# Patient Record
Sex: Male | Born: 1945 | Race: Black or African American | Hispanic: No | Marital: Married | Smoking: Never smoker
Health system: Southern US, Community
[De-identification: ages and names within clinical notes are randomized; demographics above are authoritative.]

## PROBLEM LIST (undated history)

## (undated) DIAGNOSIS — I10 Essential (primary) hypertension: Secondary | ICD-10-CM

---

## 2015-08-05 ENCOUNTER — Encounter (HOSPITAL_COMMUNITY): Payer: Self-pay | Admitting: Emergency Medicine

## 2015-08-05 ENCOUNTER — Emergency Department (INDEPENDENT_AMBULATORY_CARE_PROVIDER_SITE_OTHER)
Admission: EM | Admit: 2015-08-05 | Discharge: 2015-08-05 | Disposition: A | Discharge status: Home / Self Care | Payer: Medicare Other | Source: Home / Self Care | Attending: Emergency Medicine | Admitting: Emergency Medicine

## 2015-08-05 DIAGNOSIS — J40 Bronchitis, not specified as acute or chronic: Secondary | ICD-10-CM | POA: Diagnosis not present

## 2015-08-05 HISTORY — DX: Essential (primary) hypertension: I10

## 2015-08-05 MED ORDER — AZITHROMYCIN 250 MG PO TABS: ORAL_TABLET | ORAL | Status: AC

## 2015-08-05 MED ORDER — PREDNISONE 50 MG PO TABS: ORAL_TABLET | ORAL | Status: AC

## 2015-08-05 MED ORDER — HYDROCODONE-HOMATROPINE 5-1.5 MG/5ML PO SYRP: 5.0000 mL | ORAL_SOLUTION | Freq: Four times a day (QID) | ORAL | Status: AC | PRN

## 2015-08-05 NOTE — ED Notes (Signed)
Cough and nagging cough.  Chest soreness with coughing. Symptoms for one week.  Patient and spouse thought he was improving, now not so sure.  Noticed sob with using steps at home yesterday

## 2015-08-05 NOTE — ED Provider Notes (Signed)
CSN: [id number]     Arrival date & time 08/05/15  1949 History   First MD Initiated Contact with Patient 08/05/15 2055     Chief Complaint  Patient presents with  . Cough  . Chest Pain   (Consider location/radiation/quality/duration/timing/severity/associated sxs/prior Treatment) HPI He is a 70 year old man here for evaluation of cough. His symptoms started about a week ago with cough and nasal congestion. He took some over-the-counter medications and seemed to be getting better, until the last day or 2. He reports worsening cough and congestion. His cough is productive of mucus. He describes posttussive gagging and emesis. He also reports pain in his chest with coughing. No fevers or wheezing. He does report feeling a little short of breath with exertion and after coughing spells. He has been using an over-the-counter cough medicine with good improvement of the cough.  No diaphoresis or dizziness.  Past Medical History  Diagnosis Date  . Hypertension    History reviewed. No pertinent past surgical history. No family history on file. Social History  Substance Use Topics  . Smoking status: Never Smoker   . Smokeless tobacco: None  . Alcohol Use: Yes    Review of Systems As in history of present illness Allergies  Review of patient's allergies indicates no known allergies.  Home Medications   Prior to Admission medications   Medication Sig Start Date End Date Taking? Authorizing Provider  azithromycin (ZITHROMAX Z-PAK) 250 MG tablet Take 2 pills today, then 1 pill daily until gone. 08/05/15   Charm Rings, MD  HYDROcodone-homatropine [facility]) 5-1.5 MG/5ML syrup Take 5 mLs by mouth every 6 (six) hours as needed for cough. 08/05/15   Charm Rings, MD  predniSONE (DELTASONE) 50 MG tablet Take 1 pill daily for 5 days. 08/05/15   Charm Rings, MD   Meds Ordered and Administered this Visit  Medications - No data to display  BP 147/89 mmHg  Pulse 70  Temp(Src) 98 F (36.7 C) (Oral)   Resp 16  SpO2 99% No data found.   Physical Exam  Constitutional: He is oriented to person, place, and time. He appears well-developed and well-nourished. No distress.  HENT:  Nose: Nose normal.  Mouth/Throat: Oropharynx is clear and moist. No oropharyngeal exudate.  Neck: Neck supple.  Cardiovascular: Normal rate, regular rhythm and normal heart sounds.   No murmur heard. Pulmonary/Chest: Effort normal and breath sounds normal. No respiratory distress. He has no wheezes. He has no rales. He exhibits tenderness.  Lymphadenopathy:    He has no cervical adenopathy.  Neurological: He is alert and oriented to person, place, and time.    ED Course  Procedures (including critical care time)  Labs Review Labs Reviewed - No data to display  Imaging Review No results found.    MDM   1. Bronchitis    Treatment with azithromycin and prednisone. Prescription given for Hycodan to be filled if needed for cough. Return precautions reviewed.    Charm Rings, MD 08/05/15 2115

## 2015-08-05 NOTE — Discharge Instructions (Signed)
You have bronchitis. Take azithromycin and prednisone as prescribed. Use hycodan as needed for cough. He will be no longer contagious after 24 hours of antibiotics. You should see improvement in the next 3-5 days. If you develop fevers, difficulty breathing, or are just not getting better, please come back or go to the emergency room.

## 2020-03-17 ENCOUNTER — Other Ambulatory Visit: Payer: Self-pay

## 2020-03-17 ENCOUNTER — Other Ambulatory Visit: Payer: Medicare Other

## 2020-03-17 DIAGNOSIS — Z20822 Contact with and (suspected) exposure to covid-19: Secondary | ICD-10-CM

## 2020-03-18 LAB — NOVEL CORONAVIRUS, NAA: SARS-CoV-2, NAA: NOT DETECTED

## 2020-03-18 LAB — SARS-COV-2, NAA 2 DAY TAT

## 2020-09-08 ENCOUNTER — Encounter (HOSPITAL_BASED_OUTPATIENT_CLINIC_OR_DEPARTMENT_OTHER): Payer: Self-pay | Admitting: *Deleted

## 2020-09-08 ENCOUNTER — Emergency Department (HOSPITAL_BASED_OUTPATIENT_CLINIC_OR_DEPARTMENT_OTHER): Payer: Medicare Other

## 2020-09-08 ENCOUNTER — Emergency Department (HOSPITAL_BASED_OUTPATIENT_CLINIC_OR_DEPARTMENT_OTHER)
Admission: EM | Admit: 2020-09-08 | Discharge: 2020-09-09 | Disposition: A | Discharge status: Home / Self Care | Payer: Medicare Other | Attending: Emergency Medicine | Admitting: Emergency Medicine

## 2020-09-08 ENCOUNTER — Other Ambulatory Visit: Payer: Self-pay

## 2020-09-08 DIAGNOSIS — W01198A Fall on same level from slipping, tripping and stumbling with subsequent striking against other object, initial encounter: Secondary | ICD-10-CM | POA: Diagnosis not present

## 2020-09-08 DIAGNOSIS — M25532 Pain in left wrist: Secondary | ICD-10-CM

## 2020-09-08 DIAGNOSIS — M542 Cervicalgia: Secondary | ICD-10-CM | POA: Diagnosis not present

## 2020-09-08 DIAGNOSIS — I10 Essential (primary) hypertension: Secondary | ICD-10-CM | POA: Insufficient documentation

## 2020-09-08 DIAGNOSIS — S60512A Abrasion of left hand, initial encounter: Secondary | ICD-10-CM | POA: Insufficient documentation

## 2020-09-08 DIAGNOSIS — W19XXXA Unspecified fall, initial encounter: Secondary | ICD-10-CM

## 2020-09-08 DIAGNOSIS — Z23 Encounter for immunization: Secondary | ICD-10-CM | POA: Insufficient documentation

## 2020-09-08 DIAGNOSIS — S0083XA Contusion of other part of head, initial encounter: Secondary | ICD-10-CM

## 2020-09-08 DIAGNOSIS — S60511A Abrasion of right hand, initial encounter: Secondary | ICD-10-CM | POA: Insufficient documentation

## 2020-09-08 DIAGNOSIS — S0993XA Unspecified injury of face, initial encounter: Secondary | ICD-10-CM | POA: Diagnosis present

## 2020-09-08 MED ORDER — TETANUS-DIPHTH-ACELL PERTUSSIS 5-2.5-18.5 LF-MCG/0.5 IM SUSY
0.5000 mL | PREFILLED_SYRINGE | Freq: Once | INTRAMUSCULAR | Status: AC
  Administered 2020-09-08: 0.5 mL via INTRAMUSCULAR
  Filled 2020-09-08: qty 0.5

## 2020-09-08 MED ORDER — ACETAMINOPHEN 325 MG PO TABS
650.0000 mg | ORAL_TABLET | Freq: Once | ORAL | Status: AC
  Administered 2020-09-08: 650 mg via ORAL
  Filled 2020-09-08: qty 2

## 2020-09-08 NOTE — ED Triage Notes (Signed)
Emergency Medicine Provider Triage Evaluation Note  Billy Marsh , a 75 y.o. male  was evaluated in triage.  Pt complains of fall.  He tripped and landed with both outstretched arms.  He hit his face and has a small area of swelling to the left lip, no through and through mucosal surface lacerations.  Teeth are intact.  Unsure of previous tetanus shot.  Pain in the left wrist..  Review of Systems  Positive: Injury, abrasion Negative: Loss of consciousness  Physical Exam  BP (!) 162/95 (BP Location: Left Arm)   Pulse 95   Temp 98.6 F (37 C) (Oral)   Resp 16   Ht 6\' 4"  (1.93 m)   Wt 116.5 kg   SpO2 99%   BMI 31.27 kg/m  Gen:   Awake, no distress   HEENT:  Atraumatic  Resp:  Normal effort  Cardiac:  Normal rate  Abd:   Nondistended, nontender  MSK:   Pain with any movement of the left wrist Neuro:  Speech clear   Medical Decision Making  Medically screening exam initiated at 7:48 PM.  Appropriate orders placed.  NEEDHAM BIGGINS was informed that the remainder of the evaluation will be completed by another provider, this initial triage assessment does not replace that evaluation, and the importance of remaining in the ED until their evaluation is complete.  Clinical Impression  75 year old with fall.  Tetanus updated.  Images ordered.   62, PA-C 09/08/20 1952

## 2020-09-08 NOTE — ED Provider Notes (Signed)
[facility] EMERGENCY DEPARTMENT Provider Note   CSN: [id number] Arrival date & time: 09/08/20  1918     History Chief Complaint  Patient presents with  . Fall    Billy Marsh is a 75 y.o. male.  Patient presents after a fall.  He states he stumbled on the sidewalk and fell forward striking his face with outstretched arms.  He sustained abrasions to his nose and lip and with both hands.  Did not lose consciousness.  Denies any preceding dizziness or lightheadedness.  No chest pain or shortness of breath.  Complains of face pain, neck pain, bilateral hand and wrist pain. He denies any blood thinner use.  He denies any chest, back or abdominal pain. He denies any chronic medical conditions.  Tetanus was updated today. States his dentures are sore but no broken teeth or malocclusion.  The history is provided by the patient.  Fall Pertinent negatives include no chest pain, no abdominal pain, no headaches and no shortness of breath.       Past Medical History:  Diagnosis Date  . Hypertension     There are no problems to display for this patient.   History reviewed. No pertinent surgical history.     No family history on file.  Social History   Tobacco Use  . Smoking status: Never Smoker  . Smokeless tobacco: Never Used  Substance Use Topics  . Alcohol use: Yes  . Drug use: No    Home Medications Prior to Admission medications   Medication Sig Start Date End Date Taking? Authorizing Provider  azithromycin (ZITHROMAX Z-PAK) 250 MG tablet Take 2 pills today, then 1 pill daily until gone. 08/05/15   Charm Rings, MD  HYDROcodone-homatropine [facility]) 5-1.5 MG/5ML syrup Take 5 mLs by mouth every 6 (six) hours as needed for cough. 08/05/15   Charm Rings, MD  predniSONE (DELTASONE) 50 MG tablet Take 1 pill daily for 5 days. 08/05/15   Charm Rings, MD    Allergies    Neosporin original [bacitracin-neomycin-polymyxin]  Review of Systems   Review of Systems   Constitutional: Negative for activity change, appetite change and fever.  HENT: Negative for congestion and drooling.   Eyes: Negative for visual disturbance.  Respiratory: Negative for cough, chest tightness and shortness of breath.   Cardiovascular: Negative for chest pain.  Gastrointestinal: Negative for abdominal pain, nausea and vomiting.  Genitourinary: Negative for dysuria and hematuria.  Musculoskeletal: Positive for arthralgias and myalgias.  Skin: Positive for wound.  Neurological: Negative for dizziness, weakness and headaches.   all other systems are negative except as noted in the HPI and PMH.    Physical Exam Updated Vital Signs BP (!) 162/95 (BP Location: Left Arm)   Pulse 95   Temp 98.6 F (37 C) (Oral)   Resp 16   Ht 6\' 4"  (1.93 m)   Wt 116.5 kg   SpO2 99%   BMI 31.27 kg/m   Physical Exam Vitals and nursing note reviewed.  Constitutional:      General: He is not in acute distress.    Appearance: He is well-developed.  HENT:     Head: Normocephalic.     Nose:     Comments: Abrasion L nasal ala Abrasion L upper lip.  No through and through lacerations Dentition intact    Mouth/Throat:     Pharynx: No oropharyngeal exudate.  Eyes:     Conjunctiva/sclera: Conjunctivae normal.     Pupils: Pupils are equal,  round, and reactive to light.  Neck:     Comments: Diffuse paraspinal C spine tenderness Cardiovascular:     Rate and Rhythm: Normal rate and regular rhythm.     Heart sounds: Normal heart sounds. No murmur heard.   Pulmonary:     Effort: Pulmonary effort is normal. No respiratory distress.     Breath sounds: Normal breath sounds.  Abdominal:     Palpations: Abdomen is soft.     Tenderness: There is no abdominal tenderness. There is no guarding or rebound.  Musculoskeletal:        General: Swelling, tenderness and signs of injury present.     Cervical back: Normal range of motion and neck supple.     Comments: Abrasions to bilateral palms.   There is diffuse tenderness to the left wrist and snuffbox tenderness. Reduced ROM L fingers Tenderness to left forearm. No tenderness to right snuffbox. Full range of motion of bilateral shoulders and elbows.  Skin:    General: Skin is warm.  Neurological:     Mental Status: He is alert and oriented to person, place, and time.     Cranial Nerves: No cranial nerve deficit.     Motor: No abnormal muscle tone.     Coordination: Coordination normal.     Comments:  5/5 strength throughout. CN 2-12 intact.Equal grip strength.   Psychiatric:        Behavior: Behavior normal.     ED Results / Procedures / Treatments   Labs (all labs ordered are listed, but only abnormal results are displayed) Labs Reviewed - No data to display  EKG None  Radiology DG Wrist Complete Left  Result Date: 09/08/2020 CLINICAL DATA:  Trip and fall injury.  Pain and abrasions. EXAM: LEFT WRIST - COMPLETE 3+ VIEW COMPARISON:  None. FINDINGS: There is an old appearing ununited ossicle over the dorsal aspect of the wrist. No evidence of acute fracture or dislocation. No focal bone destruction. Soft tissues are unremarkable. Benign-appearing bone cyst in the distal ulna. IMPRESSION: No acute bony abnormalities. 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 Electronically Signed   By: Burman Nieves M.D.   On: 09/08/2020 20:27   DG Wrist Complete Right  Result Date: 09/08/2020 CLINICAL DATA:  Trip and fall injury with pain and abrasions to the wrist. EXAM: RIGHT WRIST - COMPLETE 3+ VIEW COMPARISON:  None. FINDINGS: Prominent degenerative changes in the radiocarpal, radioulnar, and STT joints. No evidence of acute fracture or dislocation. Degenerative subcortical cyst demonstrated in the distal radius and ulna. No destructive or expansile bone lesions. Soft tissues are unremarkable. 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 No acute bony abnormalities. Electronically Signed   By: Burman Nieves M.D.   On: 09/08/2020 20:28   CT Head Wo Contrast  Result Date: 09/08/2020 CLINICAL DATA:  Patient fell earlier today with facial trauma. Pain to the back of the head. EXAM: CT HEAD WITHOUT CONTRAST CT MAXILLOFACIAL WITHOUT CONTRAST TECHNIQUE: Multidetector CT imaging of the head and maxillofacial structures were performed using the standard protocol without intravenous contrast. Multiplanar CT image reconstructions of the maxillofacial structures were also generated. COMPARISON:  MRI brain 03/09/2016 FINDINGS: CT HEAD FINDINGS Brain: No evidence of acute infarction, hemorrhage, hydrocephalus, extra-axial collection or mass lesion/mass effect. Mild cerebral atrophy. Vascular: Mild intracranial arterial vascular calcifications. Skull: Calvarium appears intact. No acute depressed skull fractures. Other: None. CT MAXILLOFACIAL FINDINGS Osseous: The nasal bones, orbital bones, facial bones, and mandibles appear intact.  No acute displaced fractures are identified. Multiple prior tooth extractions. Degenerative changes noted in the cervical spine. Orbits: The globes and extraocular muscles appear intact and symmetrical. Sinuses: Mucosal thickening in the sphenoid sinus. Paranasal sinuses are otherwise clear. Soft tissues: No significant facial or periorbital soft tissue swelling or gas. IMPRESSION: 1. No acute intracranial abnormalities. Mild cerebral atrophy. 2. No acute displaced orbital or facial fractures identified. Electronically Signed   By: Burman Nieves M.D.   On: 09/08/2020 20:23   CT Maxillofacial Wo Contrast  Result Date: 09/08/2020 CLINICAL DATA:  Patient fell earlier today with facial trauma. Pain to the back of the head. EXAM: CT HEAD WITHOUT CONTRAST CT MAXILLOFACIAL WITHOUT CONTRAST TECHNIQUE: Multidetector CT imaging of the head and maxillofacial structures were performed using the standard protocol without intravenous contrast. Multiplanar CT image reconstructions of the maxillofacial  structures were also generated. COMPARISON:  MRI brain 03/09/2016 FINDINGS: CT HEAD FINDINGS Brain: No evidence of acute infarction, hemorrhage, hydrocephalus, extra-axial collection or mass lesion/mass effect. Mild cerebral atrophy. Vascular: Mild intracranial arterial vascular calcifications. Skull: Calvarium appears intact. No acute depressed skull fractures. Other: None. CT MAXILLOFACIAL FINDINGS Osseous: The nasal bones, orbital bones, facial bones, and mandibles appear intact. No acute displaced fractures are identified. Multiple prior tooth extractions. Degenerative changes noted in the cervical spine. Orbits: The globes and extraocular muscles appear intact and symmetrical. Sinuses: Mucosal thickening in the sphenoid sinus. Paranasal sinuses are otherwise clear. Soft tissues: No significant facial or periorbital soft tissue swelling or gas. IMPRESSION: 1. No acute intracranial abnormalities. Mild cerebral atrophy. 2. No acute displaced orbital or facial fractures identified. Electronically Signed   By: Burman Nieves M.D.   On: 09/08/2020 20:23    Procedures Procedures   Medications Ordered in ED Medications  acetaminophen (TYLENOL) tablet 650 mg (650 mg Oral Given 09/08/20 2253)  Tdap (BOOSTRIX) injection 0.5 mL (0.5 mLs Intramuscular Given 09/08/20 2254)    ED Course  I have reviewed the triage vital signs and the nursing notes.  Pertinent labs & imaging results that were available during my care of the patient were reviewed by me and considered in my medical decision making (see chart for details).    MDM Rules/Calculators/A&P                         Mechanical fall with facial injury and bilateral hand and wrist injuries.  Vitals are stable.  Tetanus is updated.  CT head and max face are negative.  Wrist x-rays are negative.  Patient however does have snuffbox tenderness and will place in spica splint.  Traumatic imaging is negative.  Patient with no fractures in his wrists or  hands.  CT head, C-spine x-rays are negative.  Given his persistent tenderness on the left wrist were placed in spica splint. Follow-up with hand surgery for concern for occult scaphoid fracture.  Use Tylenol or ibuprofen as needed for musculoskeletal pain.  Follow-up with hand surgery.  Return precautions discussed. Final Clinical Impression(s) / ED Diagnoses Final diagnoses:  Fall, initial encounter  Contusion of face, initial encounter  Left wrist pain    Rx / DC Orders ED Discharge Orders    None       Liyat Faulkenberry, Jeannett Senior, MD 09/09/20 [phone]

## 2020-09-08 NOTE — ED Triage Notes (Signed)
Pt tripped over uneven pavement and fell. Pain to both palms of his hands with abrasions noted, left eyelid and left side of his nose. No LOC.

## 2020-09-09 NOTE — Discharge Instructions (Signed)
Your x-rays and CT scans are negative for broken bones or acute injury.  As we discussed there is a suspicion of a possible occult fracture in your left wrist.  Wear the splint and follow-up with the hand surgeon in the office.  You may use Tylenol or ibuprofen as needed for pain.  Return to the ED with new or worsening symptoms.

## 2022-04-28 IMAGING — CT CT CERVICAL SPINE W/O CM
4 series · 15 of 33 positions shown, 18 images · non-contrast
Comparison: None.

CLINICAL DATA: Fall

EXAM:
CT CERVICAL SPINE WITHOUT CONTRAST
TECHNIQUE: Multidetector CT imaging of the cervical spine was performed without
intravenous contrast. Multiplanar CT image reconstructions were also
generated.

[Series 3: c spine soft · axial · 0.35mm/px · z∈[-185,-155]mm · 2 of 90 slices shown]
[im 15/90  soft-tissue]
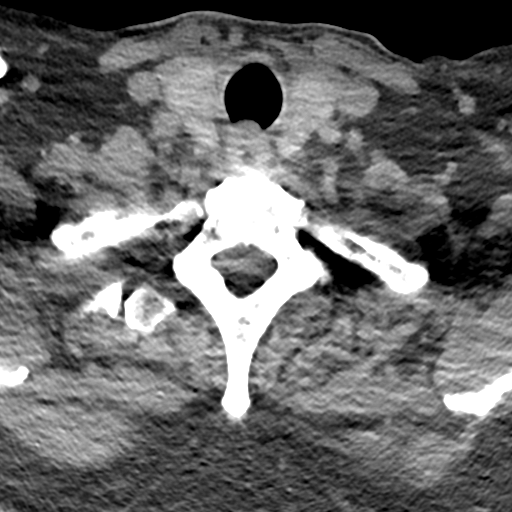
[im 30/90  soft-tissue]
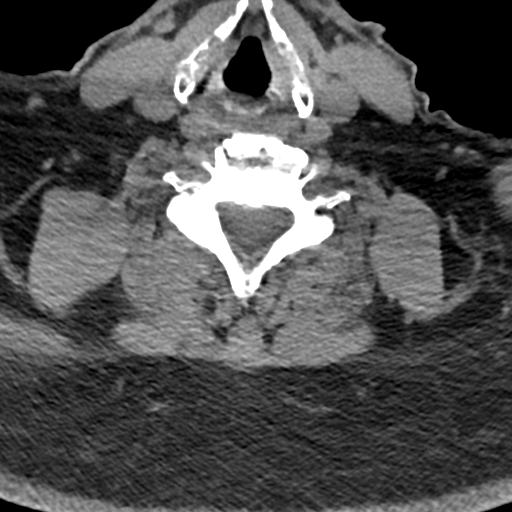

[Series 5: sagittal bone · sagittal · 0.30mm/px · 5 of 61 slices shown, 6 images]
[im 21/61  bone]
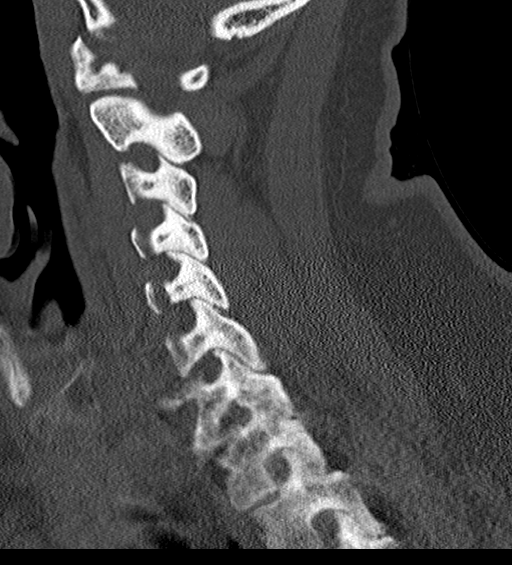
[im 26/61  bone]
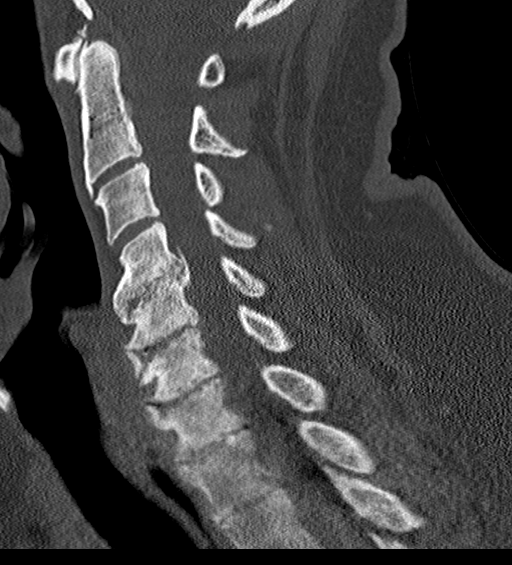
[im 31/61  soft-tissue]
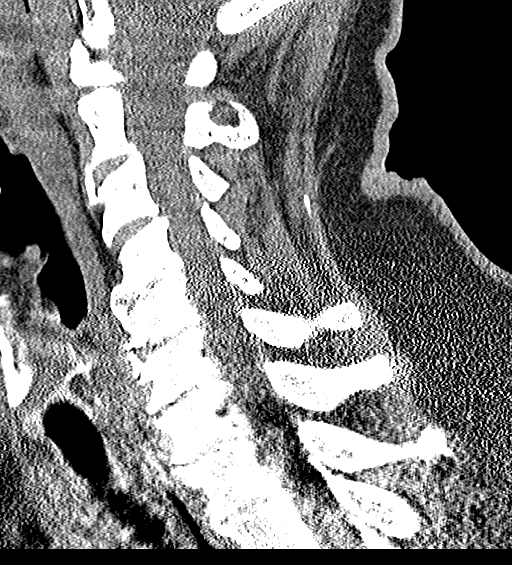
[im 31/61  bone]
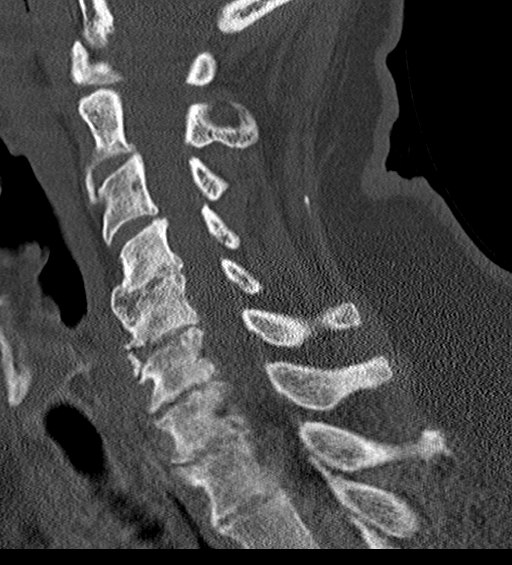
[im 36/61  bone]
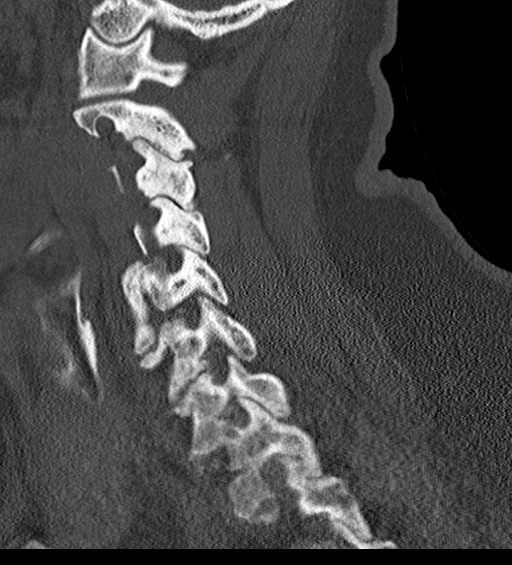
[im 41/61  bone]
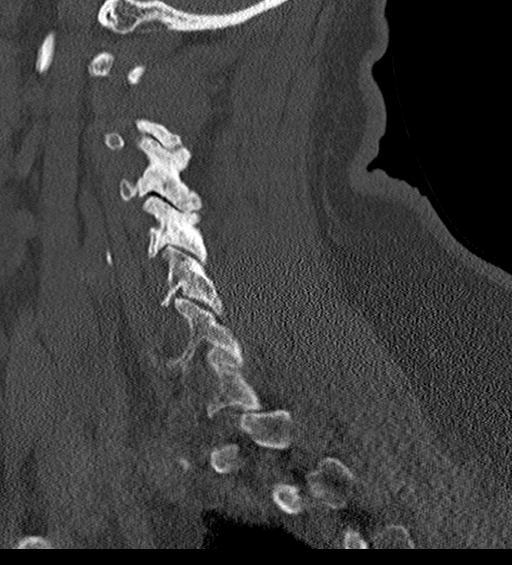

[Series 6: coronal bone · coronal · 0.26mm/px · 3 of 57 slices shown]
[im 12/57  bone]
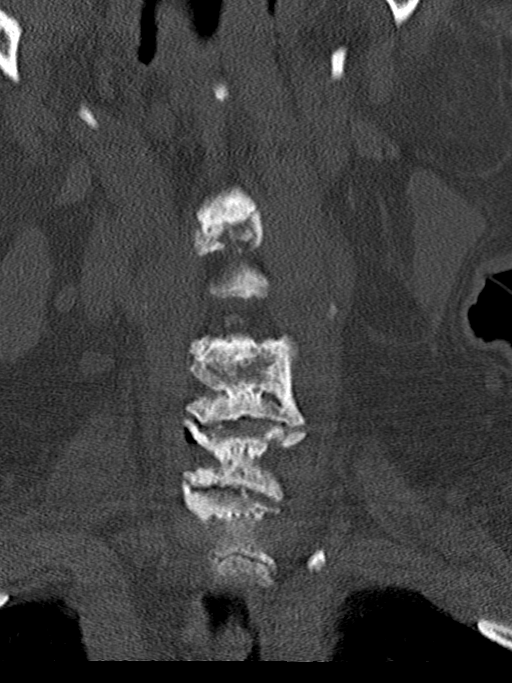
[im 23/57  bone]
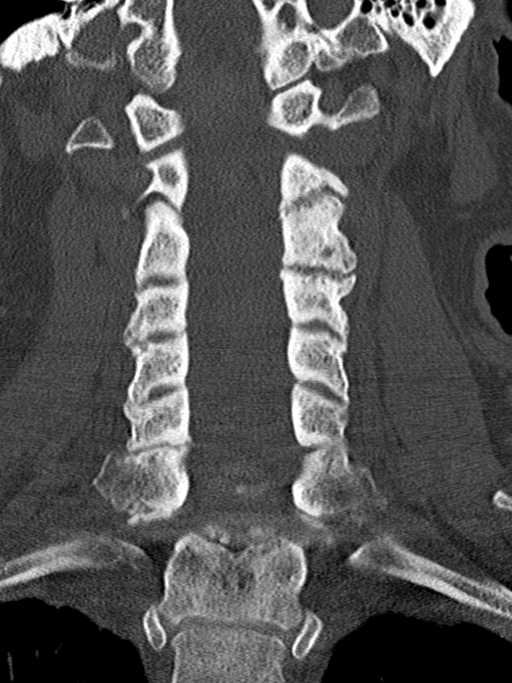
[im 34/57  bone]
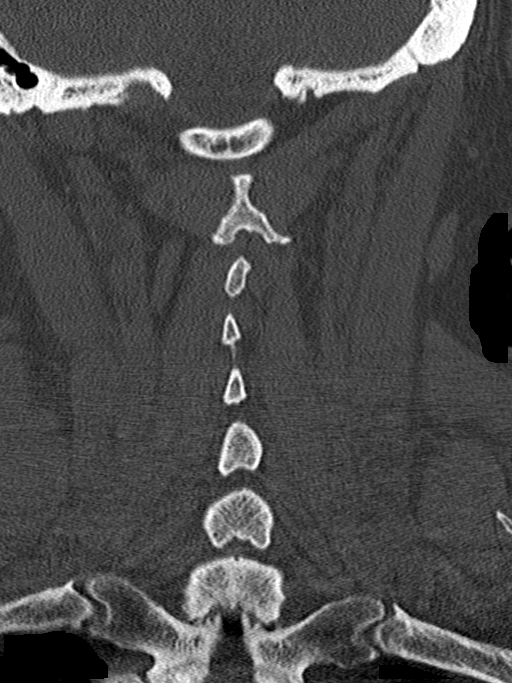

[Series 7: orthogonal bone · axial · 0.23mm/px · z∈[-202,-86]mm · 5 of 90 slices shown, 7 images]
[im 15/90  soft-tissue]
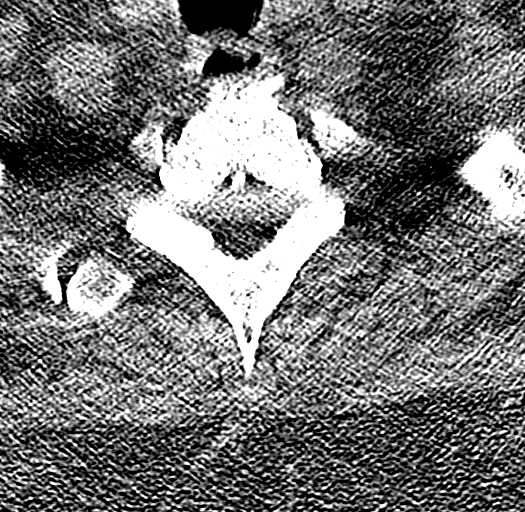
[im 15/90  bone]
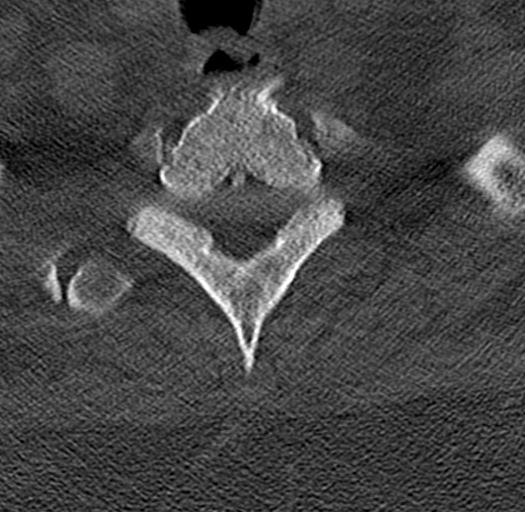
[im 30/90  bone]
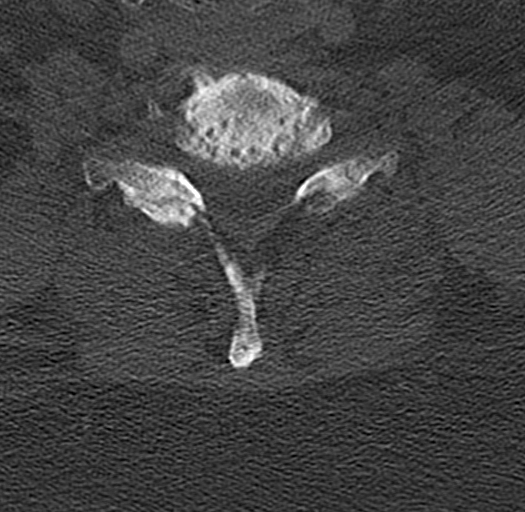
[im 45/90  bone]
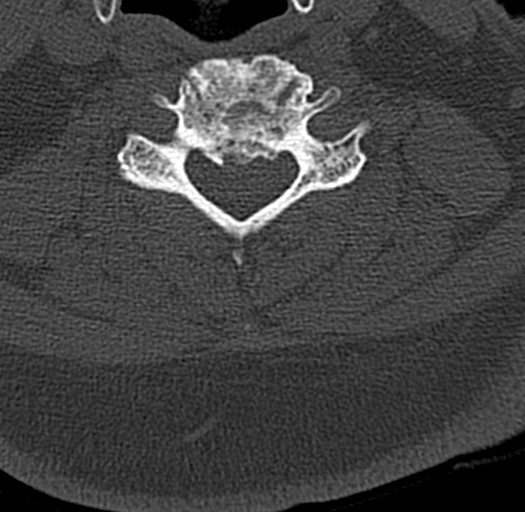
[im 60/90  bone]
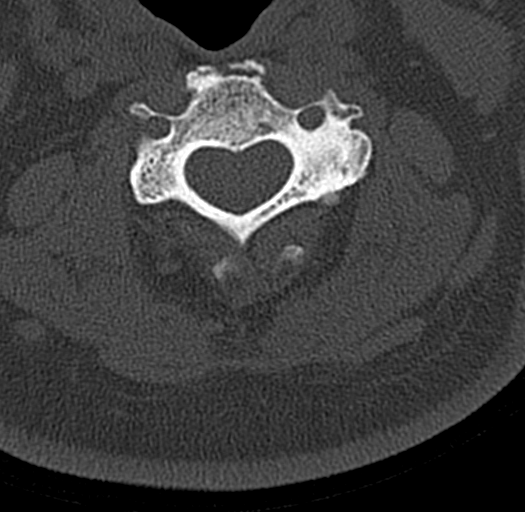
[im 75/90  soft-tissue]
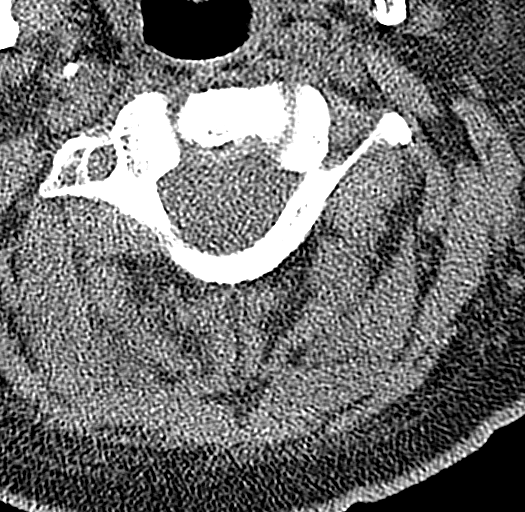
[im 75/90  bone]
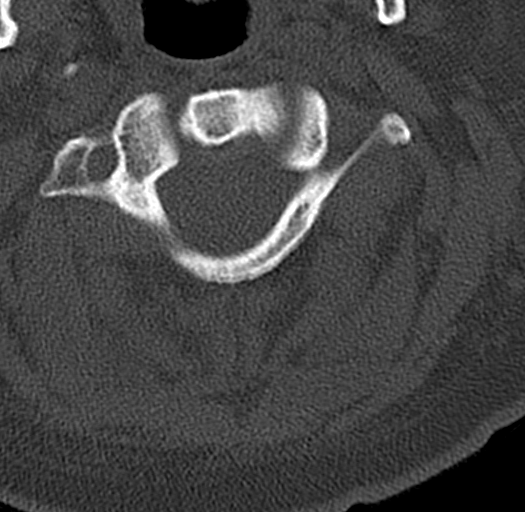

[15 of 33 positions shown; findings below may reference images not displayed]

FINDINGS: Alignment: No subluxation.

Skull base and vertebrae: No acute fracture. No primary bone lesion
or focal pathologic process.

Soft tissues and spinal canal: No prevertebral fluid or swelling. No
visible canal hematoma.

Disc levels: Diffuse degenerative disc disease and facet disease,
moderate to advanced.

Upper chest: No acute findings

Other: None
IMPRESSION: Moderate to advanced cervical spondylosis. No acute bony
abnormality.

## 2022-04-28 IMAGING — DX DG WRIST COMPLETE 3+V*L*
4 series · 4 of 4 positions shown · non-contrast
Comparison: None.

CLINICAL DATA: Trip and fall injury.  Pain and abrasions.

EXAM:
LEFT WRIST - COMPLETE 3+ VIEW

[wrist pa]
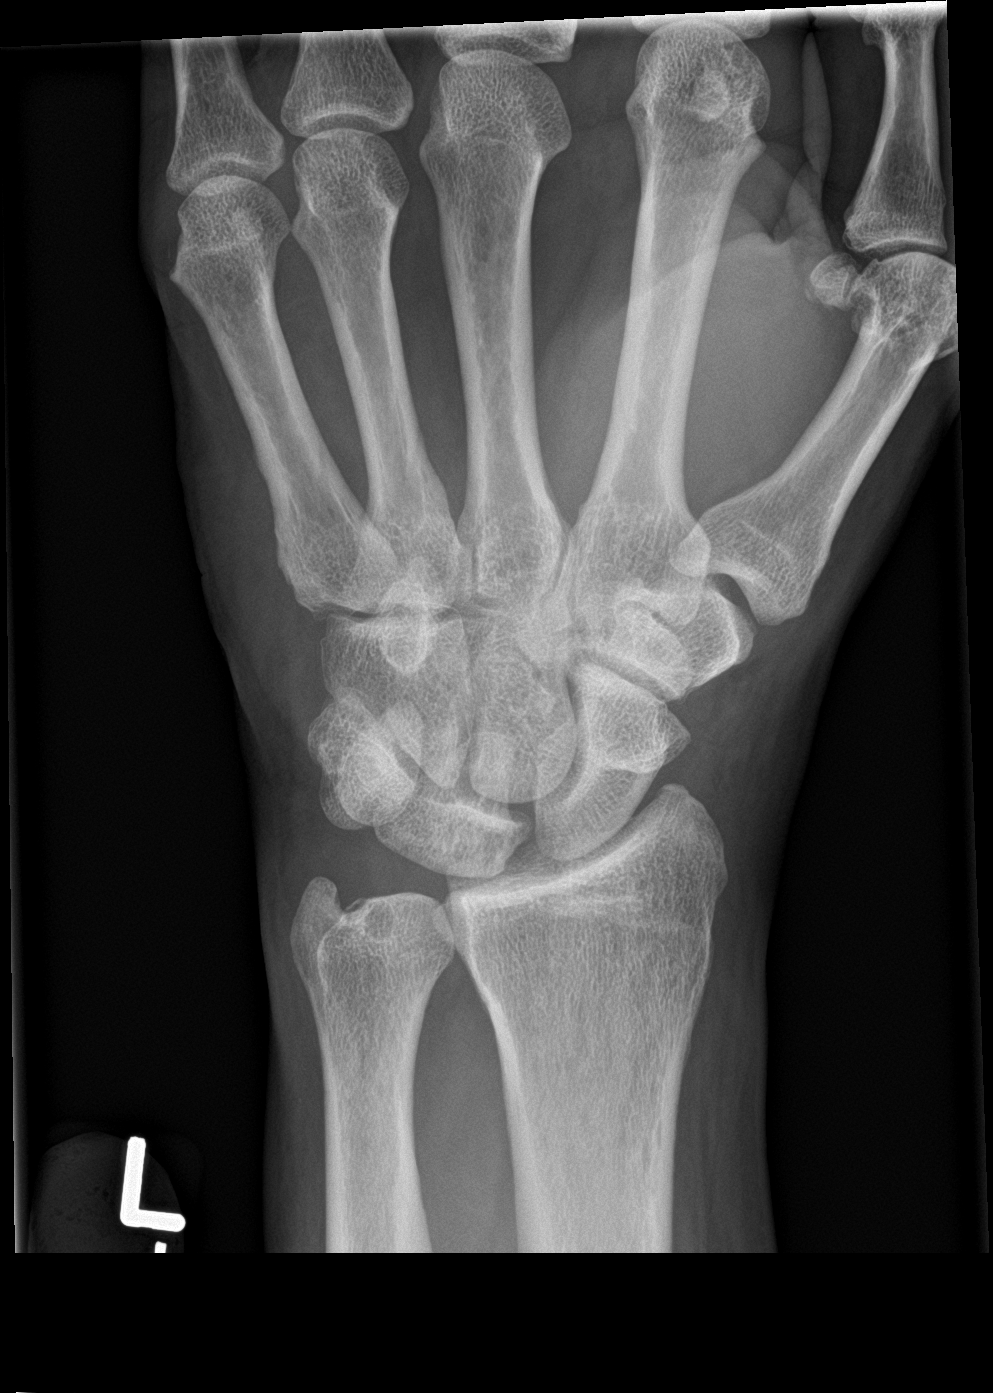

[wrist obl]
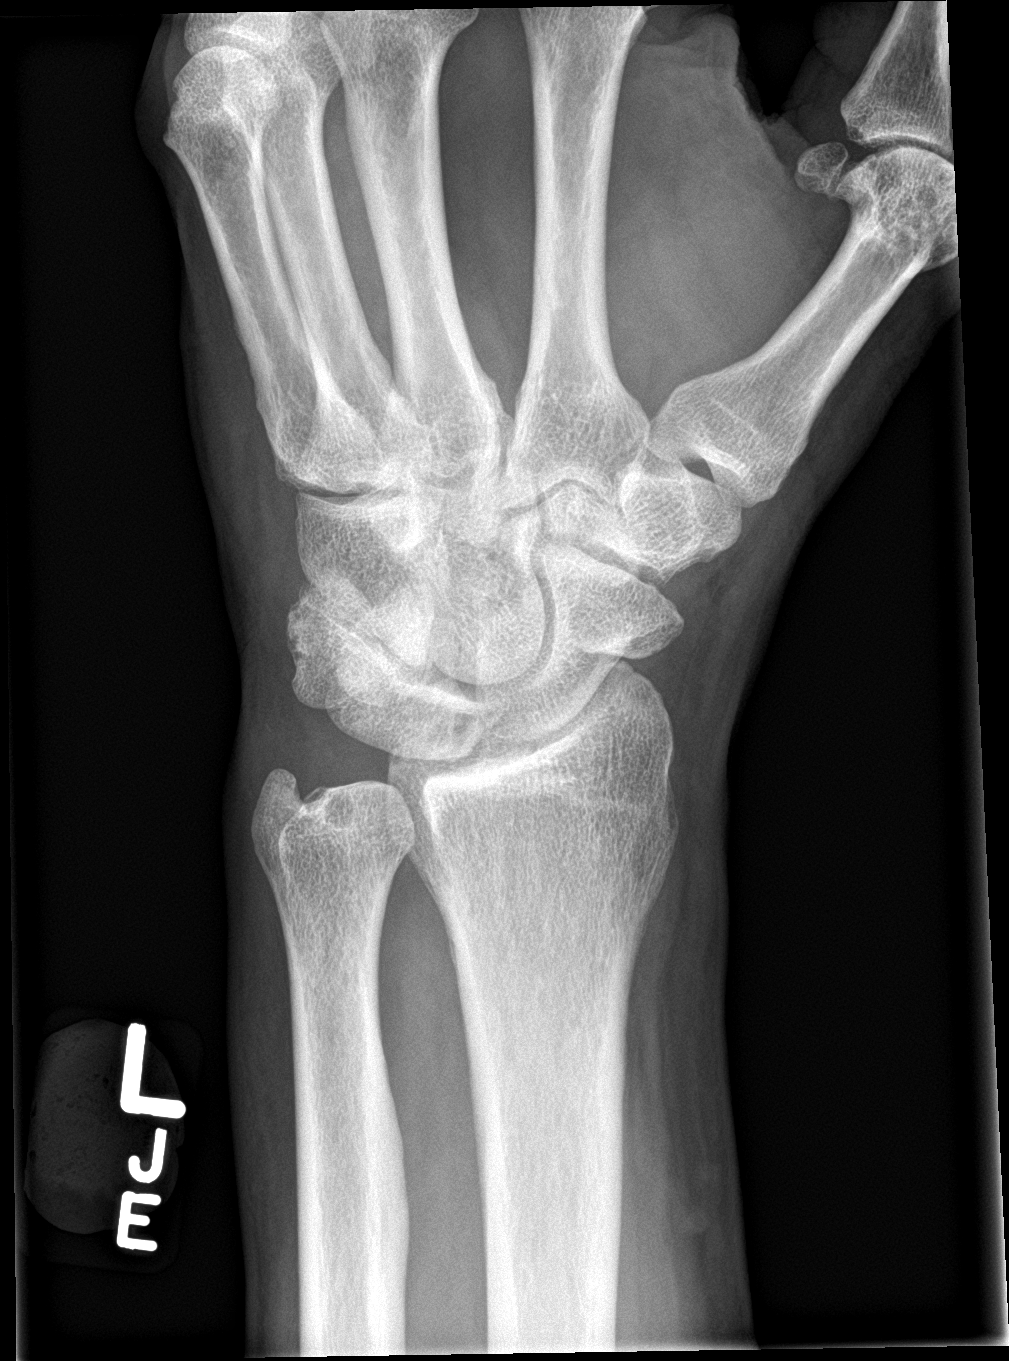

[wrist lat]
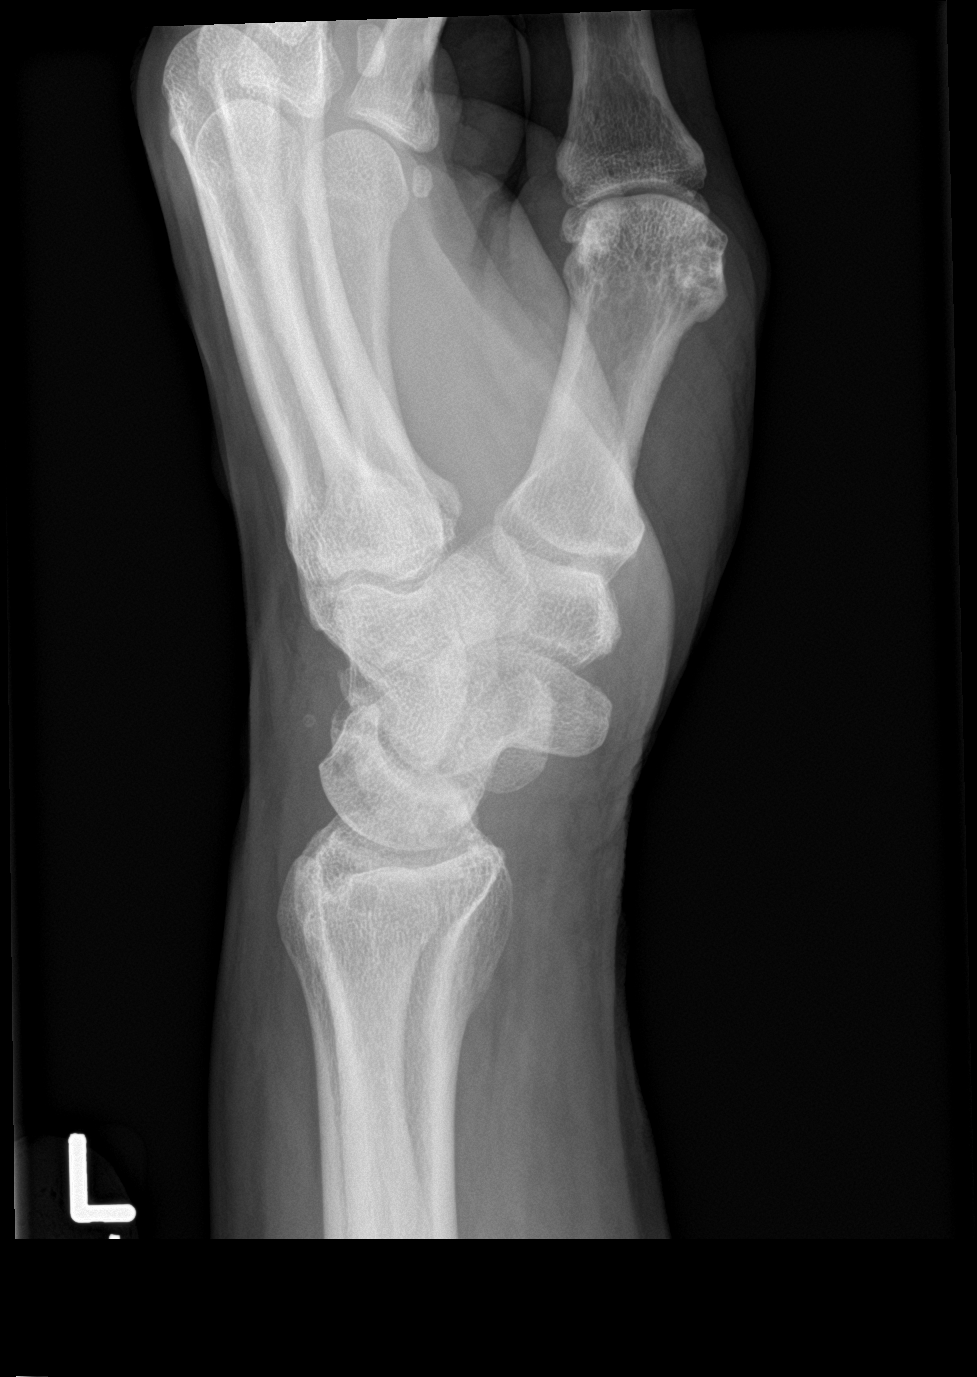

[wrist navicular]
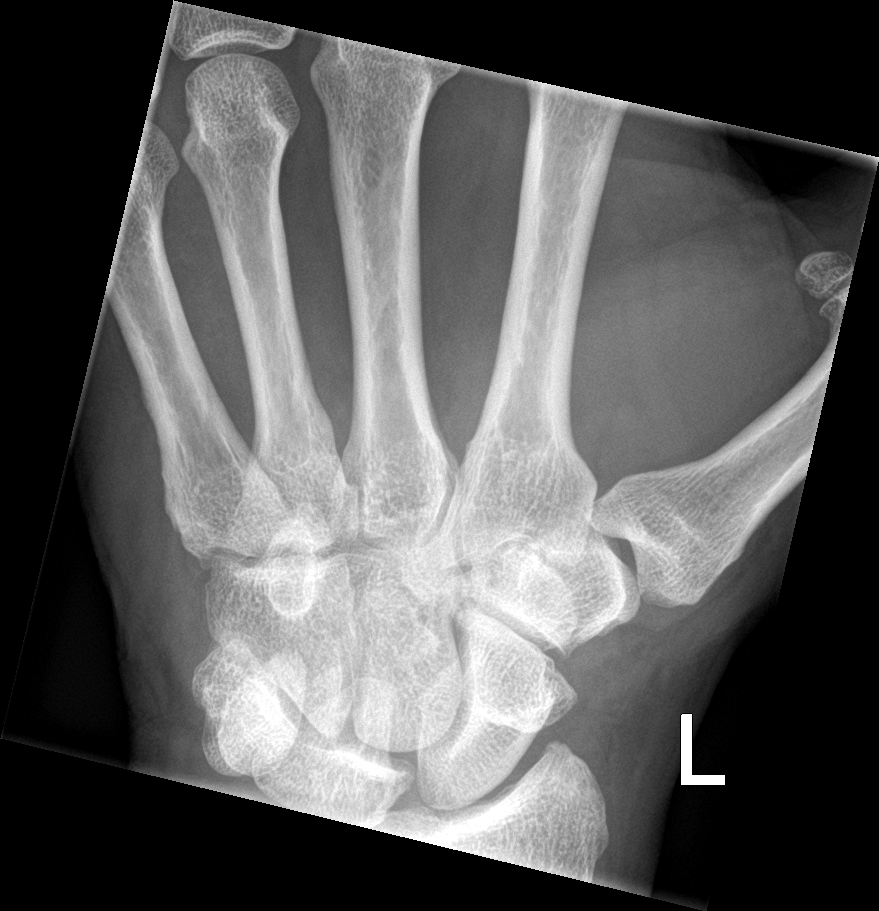

[4 of 4 positions shown; findings below may reference images not displayed]

FINDINGS: There is an old appearing ununited ossicle over the dorsal aspect of
the wrist. No evidence of acute fracture or dislocation. No focal
bone destruction. Soft tissues are unremarkable. Benign-appearing
bone cyst in the distal ulna.
IMPRESSION: No acute bony abnormalities. Old appearing ununited ossicle over the
dorsal aspect of the wrist.
